# Patient Record
Sex: Female | Born: 1955 | Race: White | Hispanic: No | Marital: Married | Smoking: Former smoker
Health system: Southern US, Community
[De-identification: ages and names within clinical notes are randomized; demographics above are authoritative.]

## PROBLEM LIST (undated history)

## (undated) DIAGNOSIS — F329 Major depressive disorder, single episode, unspecified: Secondary | ICD-10-CM

## (undated) DIAGNOSIS — G939 Disorder of brain, unspecified: Secondary | ICD-10-CM

## (undated) DIAGNOSIS — N390 Urinary tract infection, site not specified: Secondary | ICD-10-CM

## (undated) DIAGNOSIS — F32A Depression, unspecified: Secondary | ICD-10-CM

## (undated) DIAGNOSIS — F04 Amnestic disorder due to known physiological condition: Secondary | ICD-10-CM

## (undated) DIAGNOSIS — K5641 Fecal impaction: Secondary | ICD-10-CM

## (undated) HISTORY — DX: Depression, unspecified: F32.A

## (undated) HISTORY — DX: Fecal impaction: K56.41

## (undated) HISTORY — DX: Disorder of brain, unspecified: G93.9

## (undated) HISTORY — DX: Urinary tract infection, site not specified: N39.0

## (undated) HISTORY — DX: Amnestic disorder due to known physiological condition: F04

## (undated) HISTORY — DX: Major depressive disorder, single episode, unspecified: F32.9

---

## 2015-06-10 ENCOUNTER — Encounter (INDEPENDENT_AMBULATORY_CARE_PROVIDER_SITE_OTHER): Payer: Self-pay | Admitting: *Deleted

## 2015-07-07 ENCOUNTER — Encounter (INDEPENDENT_AMBULATORY_CARE_PROVIDER_SITE_OTHER): Payer: Self-pay | Admitting: Internal Medicine

## 2015-07-07 ENCOUNTER — Ambulatory Visit (INDEPENDENT_AMBULATORY_CARE_PROVIDER_SITE_OTHER): Payer: Medicare PPO | Admitting: Internal Medicine

## 2015-07-07 ENCOUNTER — Encounter (INDEPENDENT_AMBULATORY_CARE_PROVIDER_SITE_OTHER): Payer: Self-pay | Admitting: *Deleted

## 2015-07-07 VITALS — BP 132/76 | HR 88 | Temp 98.4°F | Ht 62.0 in | Wt 123.9 lb

## 2015-07-07 DIAGNOSIS — F32A Depression, unspecified: Secondary | ICD-10-CM | POA: Insufficient documentation

## 2015-07-07 DIAGNOSIS — R197 Diarrhea, unspecified: Secondary | ICD-10-CM | POA: Diagnosis not present

## 2015-07-07 DIAGNOSIS — K449 Diaphragmatic hernia without obstruction or gangrene: Secondary | ICD-10-CM | POA: Insufficient documentation

## 2015-07-07 DIAGNOSIS — G931 Anoxic brain damage, not elsewhere classified: Secondary | ICD-10-CM | POA: Insufficient documentation

## 2015-07-07 DIAGNOSIS — F329 Major depressive disorder, single episode, unspecified: Secondary | ICD-10-CM | POA: Insufficient documentation

## 2015-07-07 DIAGNOSIS — F04 Amnestic disorder due to known physiological condition: Secondary | ICD-10-CM | POA: Insufficient documentation

## 2015-07-07 DIAGNOSIS — R140 Abdominal distension (gaseous): Secondary | ICD-10-CM

## 2015-07-07 DIAGNOSIS — N390 Urinary tract infection, site not specified: Secondary | ICD-10-CM | POA: Insufficient documentation

## 2015-07-07 NOTE — Patient Instructions (Addendum)
US abdomen OV in 8 weeks. Stool diary

## 2015-07-07 NOTE — Progress Notes (Addendum)
   Subjective:    Patient ID: Hannah Wyatt, female    DOB: [birth year], 60 y.o.   MRN: [id number]  HPI Referred to our office by Dr. Salvatore Decent for chronic diarrhea. In [place] she was started on Flagyl x 7 days tid. She was empirically for possible infectious diarrhea. No fever associated with her symptoms. 05/22/2015:  stool cultures was negative as well as C-diff. Patient is mentally challenged. She says she had diarrhea x 2 weeks. She now says her stools are normal now. She says she does not have any diarrhea now. She says she was incontinent of stool when she had the diarrhea. She has not drank in 5 yrs.  Her appetite is good. No weight loss. Her last colonoscopy was in 2014. I will get that report. She has not been on any recent antibiotics.  Mother tells me (who is seeing Dr. Karilyn Cota today) that her daughter a couple of years ago was septic from a UTI. She was found at home and apparently was cyanotic. She suffered a brain injury from this.  Patient has not drank in over 5 yrs.    05/16/2013 Coloscopy: Family hx of colonic neoplasm. Change in bowel habits. Normal colonoscopy, but significantly limited due to stool. Scope advanced to cecum without difficulty.   05/22/2015 NA 142, K 4.9, Chloride 103, Glucose 91. Albumin 4.0. Total protein 6.6, AST 28, ALP 76, total bili 0.4, ALT 29. Urinalysis was negative.  Review of Systems Past Medical History  Diagnosis Date  . Korsakoff disease   . Depression   . Brain damage   . UTI (lower urinary tract infection)     No past surgical history on file.  No Known Allergies  No current outpatient prescriptions on file prior to visit.   No current facility-administered medications on file prior to visit.   Current Outpatient Prescriptions  Medication Sig Dispense Refill  . acetaminophen (TYLENOL) 500 MG tablet Take 500 mg by mouth every 6 (six) hours as needed.    . clonazePAM (KLONOPIN) 0.5 MG tablet Take 0.5 mg by mouth as needed for  anxiety.    . Lactobacillus-Inulin (CULTURELLE DIGESTIVE HEALTH PO) Take by mouth.    . Multiple Vitamins-Minerals (CENTRUM SILVER PO) Take by mouth.    . ziprasidone (GEODON) 20 MG capsule Take 20 mg by mouth 2 (two) times daily with a meal.     No current facility-administered medications for this visit.        Objective:   Physical Exam Blood pressure 132/76, pulse 88, temperature 98.4 F (36.9 C), height  (1.575 m), weight 123 lb 14.4 oz (56.201 kg). Alert and oriented. Skin warm and dry. Oral mucosa is moist.   . Sclera anicteric, conjunctivae is pink. Thyroid not enlarged. No cervical lymphadenopathy. Lungs clear. Heart regular rate and rhythm.  Abdomen is soft. Bowel sounds are positive. No hepatomegaly. No abdominal masses felt. Firmess noted to rt side of abdomen.    Stool brown and with consistency and guaiac negative.       Assessment & Plan:  Diarrhea which has apparently resolved.   She will follow up in 8 weeks. Will get colonoscopy report from Dr. Samuella Cota. US abdomen.

## 2015-07-08 ENCOUNTER — Encounter (INDEPENDENT_AMBULATORY_CARE_PROVIDER_SITE_OTHER): Payer: Self-pay

## 2015-07-13 ENCOUNTER — Ambulatory Visit (INDEPENDENT_AMBULATORY_CARE_PROVIDER_SITE_OTHER): Payer: Self-pay | Admitting: Internal Medicine

## 2015-07-15 ENCOUNTER — Telehealth (INDEPENDENT_AMBULATORY_CARE_PROVIDER_SITE_OTHER): Payer: Self-pay | Admitting: Internal Medicine

## 2015-07-15 ENCOUNTER — Ambulatory Visit (HOSPITAL_COMMUNITY)
Admission: RE | Admit: 2015-07-15 | Discharge: 2015-07-15 | Disposition: A | Discharge status: Home / Self Care | Payer: Medicare PPO | Source: Ambulatory Visit | Attending: Internal Medicine | Admitting: Internal Medicine

## 2015-07-15 DIAGNOSIS — R935 Abnormal findings on diagnostic imaging of other abdominal regions, including retroperitoneum: Secondary | ICD-10-CM

## 2015-07-15 DIAGNOSIS — R197 Diarrhea, unspecified: Secondary | ICD-10-CM | POA: Diagnosis not present

## 2015-07-15 DIAGNOSIS — R140 Abdominal distension (gaseous): Secondary | ICD-10-CM | POA: Diagnosis present

## 2015-07-15 DIAGNOSIS — I700 Atherosclerosis of aorta: Secondary | ICD-10-CM | POA: Diagnosis not present

## 2015-07-15 DIAGNOSIS — N281 Cyst of kidney, acquired: Secondary | ICD-10-CM | POA: Diagnosis not present

## 2015-07-15 NOTE — Telephone Encounter (Signed)
Ct ordered for abnormal [place]

## 2015-07-21 ENCOUNTER — Ambulatory Visit (HOSPITAL_COMMUNITY)
Admission: RE | Admit: 2015-07-21 | Discharge: 2015-07-21 | Disposition: A | Discharge status: Home / Self Care | Payer: Medicare PPO | Source: Ambulatory Visit | Attending: Internal Medicine | Admitting: Internal Medicine

## 2015-07-21 DIAGNOSIS — R935 Abnormal findings on diagnostic imaging of other abdominal regions, including retroperitoneum: Secondary | ICD-10-CM

## 2015-07-21 DIAGNOSIS — N281 Cyst of kidney, acquired: Secondary | ICD-10-CM | POA: Diagnosis not present

## 2015-07-21 LAB — POCT I-STAT CREATININE: CREATININE: 0.6 mg/dL (ref 0.44–1.00)

## 2015-07-21 MED ORDER — IOHEXOL 300 MG/ML  SOLN
100.0000 mL | Freq: Once | INTRAMUSCULAR | Status: AC | PRN
  Administered 2015-07-21: 100 mL via INTRAVENOUS

## 2015-07-22 ENCOUNTER — Telehealth (INDEPENDENT_AMBULATORY_CARE_PROVIDER_SITE_OTHER): Payer: Self-pay | Admitting: Internal Medicine

## 2015-07-22 NOTE — Telephone Encounter (Signed)
Hannah Wyatt, Hannah Wyatt mother called and left a message regarding Hannah Wyatt's current condition. Due to Suraiya feeling bad she took her to [facility]. She's been admitted and was given an enema and magnesium citrate with slight success. She didn't rid of much waste just liquid mostly and was having trouble urinating. Her stomach was also swelling and she felt pain to the touch so a catheter was inserted in her. They started her on Go-Lightly and tried another enema but again, mostly liquid came out. Providers think the impaction is still there and if she has no progress they may go in and try to release it manually. Per Fleet Contras, Dr. Holley Bouche is aware of the situation and said there's nothing he could do GI wise. Please give Fleet Contras a call if needed.  Thank you.

## 2015-07-22 NOTE — Telephone Encounter (Signed)
I have talked with husband

## 2015-07-23 NOTE — Telephone Encounter (Signed)
noted 

## 2015-07-23 NOTE — Telephone Encounter (Signed)
Fleet Contras, Leanor's mother; called back saying Ms. Goyer is still in the hospital and the impaction is still there. She's began a more aggressive treatment that's almost similar to a colonoscopy prep. She's comfortable now and Fleet Contras will keep you informed.   Thank you.

## 2015-07-30 ENCOUNTER — Telehealth (INDEPENDENT_AMBULATORY_CARE_PROVIDER_SITE_OTHER): Payer: Self-pay | Admitting: Internal Medicine

## 2015-07-30 NOTE — Telephone Encounter (Signed)
I advised her to give Mag Citrate and get enemas. She will call me tomorrow.

## 2015-07-30 NOTE — Telephone Encounter (Signed)
Hannah Wyatt, [place] mother left a message saying she gave Fleet Contras an enema and an entire bottle of Magnesium per your instruction. She believes it was successful because Carrie had two bowel movements. She said she'll continue to give her Miralax for a few days to make sure her bowel movements are consistent. She wanted to make you aware of this.  Thank you.

## 2015-07-30 NOTE — Telephone Encounter (Signed)
Hannah Wyatt, [name] mother, left a message saying while she was in the hospital she was given a gallon of "Go-Lightly" and had bowel movements consistently multiple times a day from Thursday to early Saturday morning before being discharged. Since Saturday, she's not had a bowel movement. Fleet Contras gave her Miralax yesterday and today and she still hasn't had a bowel movement. She'd like a phone call regarding this.  Rachel's ph# [phone] or [phone]  Thank you.

## 2015-09-01 ENCOUNTER — Ambulatory Visit (INDEPENDENT_AMBULATORY_CARE_PROVIDER_SITE_OTHER): Payer: Medicare PPO | Admitting: Internal Medicine

## 2015-09-01 ENCOUNTER — Encounter (INDEPENDENT_AMBULATORY_CARE_PROVIDER_SITE_OTHER): Payer: Self-pay | Admitting: Internal Medicine

## 2015-09-01 VITALS — BP 100/84 | HR 72 | Temp 98.1°F | Ht 62.0 in | Wt 122.2 lb

## 2015-09-01 DIAGNOSIS — K5641 Fecal impaction: Secondary | ICD-10-CM

## 2015-09-01 NOTE — Progress Notes (Signed)
Subjective:    Patient ID: Hannah Wyatt, female    DOB: Oct 06, 1955, 60 y.o.   MRN: [id number] PCP Dr. Salvatore Decent.  HPI Here today for f/u of her diarrhea. She was last seen in January of this year. She underwent a CT by me in February which showed a significant fecal impaction. I advised patient to go to drug store and get 2 SS enemas and Mag Citrate. She did not have any results with the enemas and Mag Citrate. She then was advised to go to the ED for disimpaction. Patient was admitted to [facility].  She ultimately had a large BM. She was also given Golytely.  She did have a BM. She is presently taking Miralax daily.  She is having a BM daily. Her stools are nice and formed.  Her appetite is good. No weight loss. Last weight was 123. Today her weight is 122.2. She lives at him with her mother.  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  Hx of etoh abuse. She has not drank in 5 yrs.  She tells me she feels good. She is eating 3 meals a day.   07/20/2014 CT abdomen/pelvis with CM: diarrhea, abdominal mass: IMPRESSION: 1. There is abundant colonic stool in distal colon. Significant stool burden noted in distal sigmoid colon and rectum. Measures at least 18 cm cranial caudally by 13 cm x 11 cm in diameter. Findings are consistent with significant fecal impaction. There is no evidence of rectal mass. 2. No small bowel obstruction. 3. No hydronephrosis or hydroureter. 4. Normal appendix. No pericecal inflammation. 5. 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 A        05/16/2013 Coloscopy: Family hx of colonic neoplasm. Change in bowel habits. Normal colonoscopy, but significantly limited due to stool. Scope advanced to cecum without difficulty.   05/22/2015 NA 142, K 4.9, Chloride 103, Glucose 91. Albumin 4.0. Total protein 6.6, AST 28, ALP 76, total bili 0.4, ALT 29. Urinalysis was negative.  Review of Systems Past Medical History  Diagnosis Date  .  Korsakoff disease   . Depression   . Brain damage   . UTI (lower urinary tract infection)     No past surgical history on file.  No Known Allergies  Current Outpatient Prescriptions on File Prior to Visit  Medication Sig Dispense Refill  . acetaminophen (TYLENOL) 500 MG tablet Take 500 mg by mouth every 6 (six) hours as needed.    . clonazePAM (KLONOPIN) 0.5 MG tablet Take 0.5 mg by mouth as needed for anxiety.    . Lactobacillus-Inulin (CULTURELLE DIGESTIVE HEALTH PO) Take by mouth.    . Multiple Vitamins-Minerals (CENTRUM SILVER PO) Take by mouth.     No current facility-administered medications on file prior to visit.        Objective:   Physical ExamBlood pressure 100/84, pulse 72, temperature 98.1 F (36.7 C), height  (1.575 m), weight 122 lb 3.2 oz (55.43 kg). Alert and oriented. Skin warm and dry. Oral mucosa is moist.   . Sclera anicteric, conjunctivae is pink. Thyroid not enlarged. No cervical lymphadenopathy. Lungs clear. Heart regular rate and rhythm.  Abdomen is soft. Bowel sounds are positive. No hepatomegaly. No abdominal masses felt. No tenderness.  No edema to lower extremities.  Rectal exam: large amt of stool in rectum ? Impaction. Stool is not hard in rectum though.         Assessment & Plan:  Diarrhea/fecal impaction which has now resolved. She does have  a large stool burden in her rectum today, it however is soft.  Mother states she is having a BM daily with MIralax. Continue the MIralax.  Go to drug store and get a SS enema.  [facility]   Mother will call me tomorrow and let me know results.

## 2015-09-01 NOTE — Patient Instructions (Addendum)
SS enema today. Call with results tomorrow.  Miralax daily

## 2015-09-02 ENCOUNTER — Telehealth (INDEPENDENT_AMBULATORY_CARE_PROVIDER_SITE_OTHER): Payer: Self-pay | Admitting: Internal Medicine

## 2015-09-02 NOTE — Telephone Encounter (Signed)
noted 

## 2015-09-02 NOTE — Telephone Encounter (Signed)
Fleet ContrasRachel Quinlivan asked that we let Terri know she followed the instructions of giving Johnny BridgeMartha an enema and said it was very successful. She had two bowel movements last night and she's had two this morning.   Thank you.

## 2015-09-29 ENCOUNTER — Telehealth (INDEPENDENT_AMBULATORY_CARE_PROVIDER_SITE_OTHER): Payer: Self-pay | Admitting: Internal Medicine

## 2015-09-29 NOTE — Telephone Encounter (Signed)
Patient's mother Evonnie DawesRachel Beaudin called stating that her daughter is mentally impaired.  Stated that she has had diarrhea since at least Saturday, but she wasn't aware of it until she noticed that she was cleaning up and hiding the evidence from her.  She stated her daughter was not honest with with her.  She has given her Miralax.  She wants to know if you have any suggestions.  [phone] (H) [phone] (C)

## 2015-09-29 NOTE — Telephone Encounter (Signed)
I advised mother that patient probably needs to be checked for an impaction. She stated she would take [place] to [place] it is easier to get there. Hold Miralax for now.  Call me tomorrow

## 2016-02-15 ENCOUNTER — Encounter (INDEPENDENT_AMBULATORY_CARE_PROVIDER_SITE_OTHER): Payer: Self-pay

## 2016-03-03 ENCOUNTER — Ambulatory Visit (INDEPENDENT_AMBULATORY_CARE_PROVIDER_SITE_OTHER): Payer: Medicare PPO | Admitting: Internal Medicine

## 2017-04-06 IMAGING — CT CT ABD-PELV W/ CM
2 of 4 series · 15 of 46 positions shown, 17 images · IV contrast (Omnipaque 300)
Comparison: Abdominal ultrasound 07/15/2015

CLINICAL DATA: Abdominal abdominal ultrasound, possible rectal mass

EXAM:
CT ABDOMEN AND PELVIS WITH CONTRAST
TECHNIQUE: Multidetector CT imaging of the abdomen and pelvis was performed
using the standard protocol following bolus administration of
intravenous contrast.
CONTRAST:  100mL OMNIPAQUE IOHEXOL 300 MG/ML  SOLN

[Series 2: abd_pel_with 5.0 b40f · axial · 0.65mm/px · z∈[+546,+961]mm · 12 of 93 slices shown, 14 images]
[im 5/93  soft-tissue]
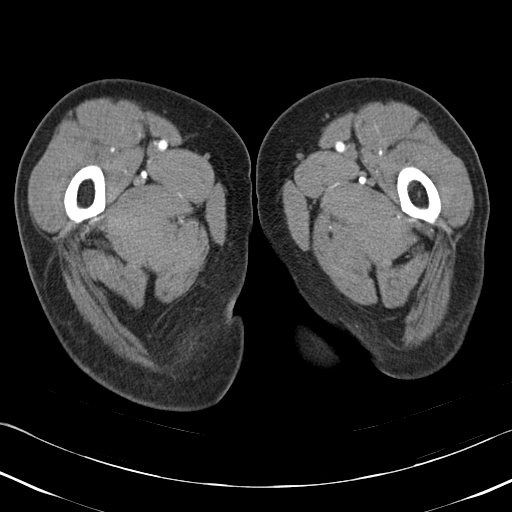
[im 5/93  bone]
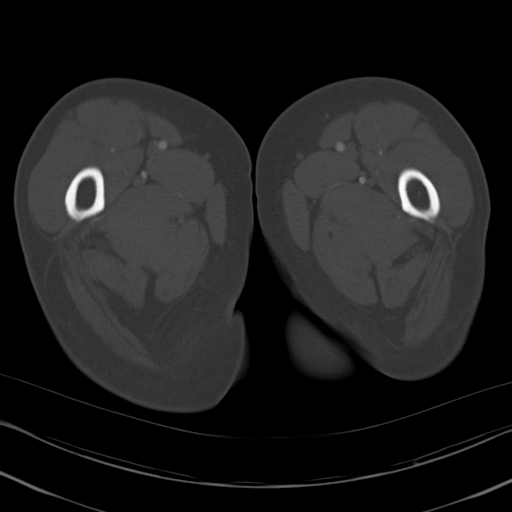
[im 14/93  soft-tissue]
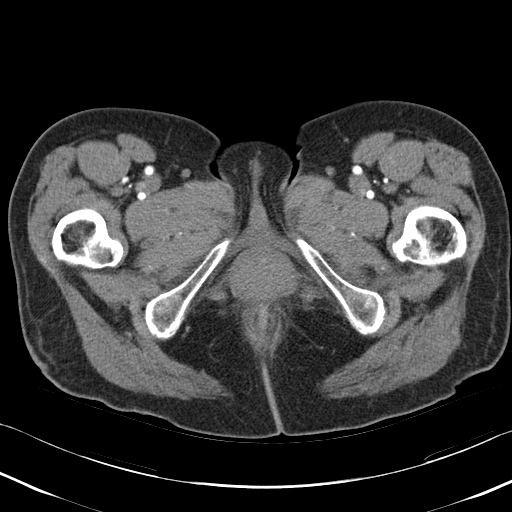
[im 19/93  soft-tissue]
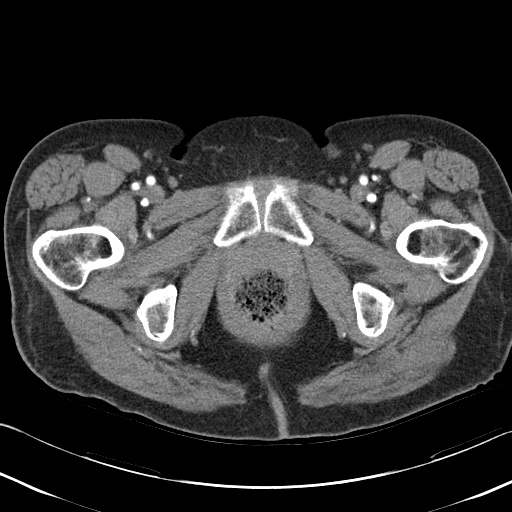
[im 28/93  soft-tissue]
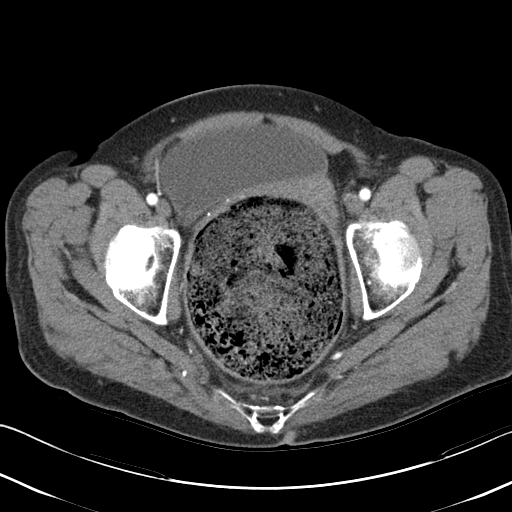
[im 37/93  soft-tissue]
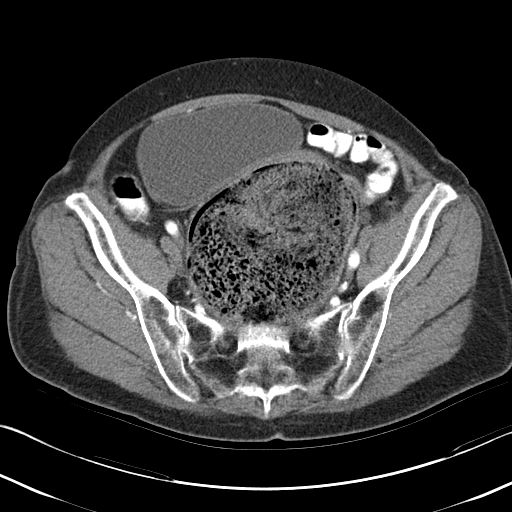
[im 42/93  soft-tissue]
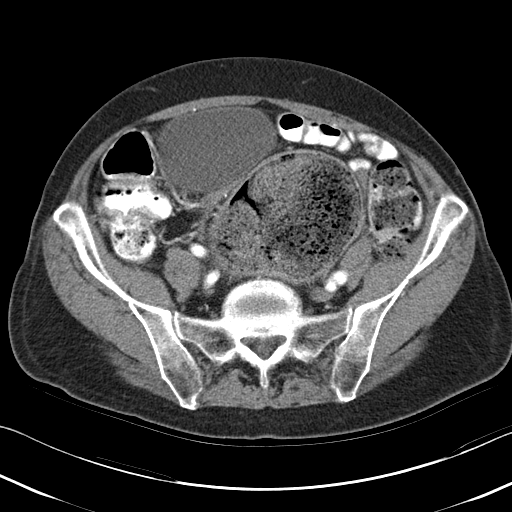
[im 51/93  soft-tissue]
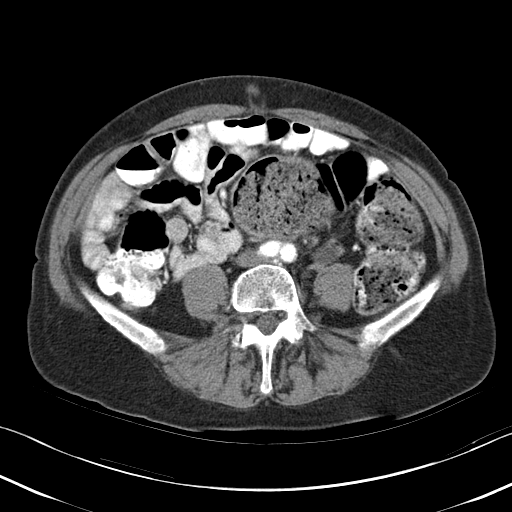
[im 56/93  soft-tissue]
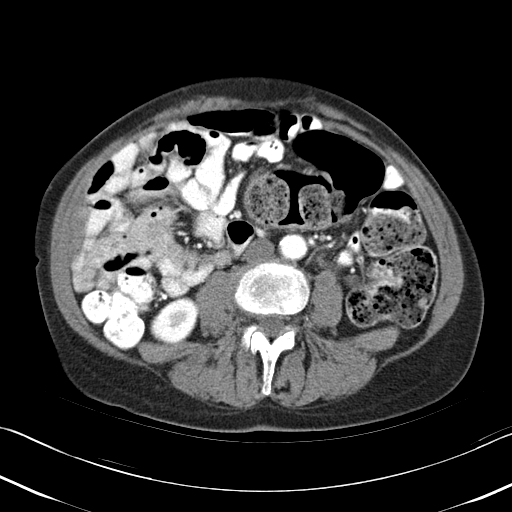
[im 65/93  soft-tissue]
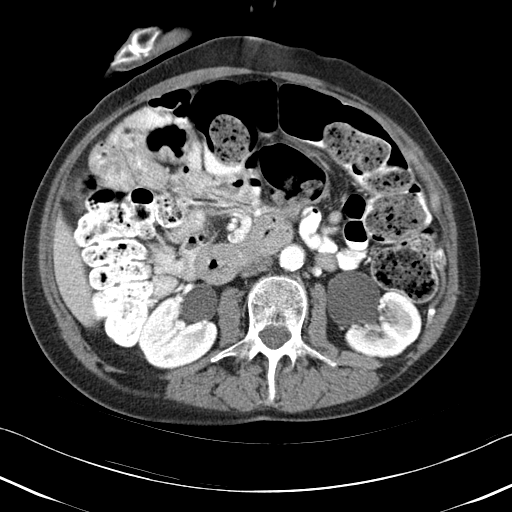
[im 65/93  bone]
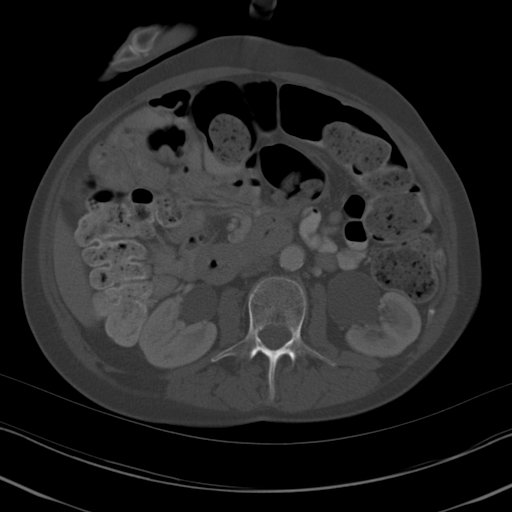
[im 74/93  soft-tissue]
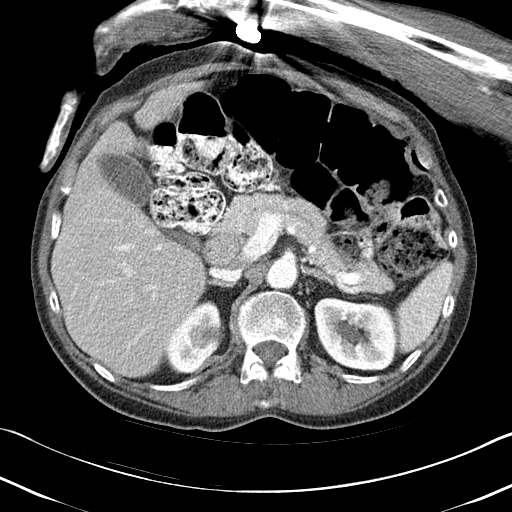
[im 79/93  soft-tissue]
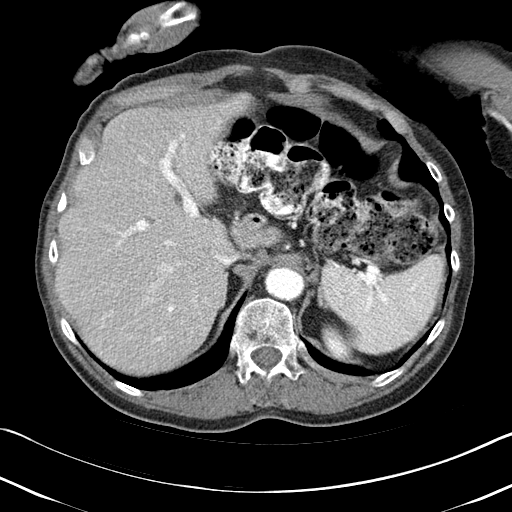
[im 88/93  soft-tissue]
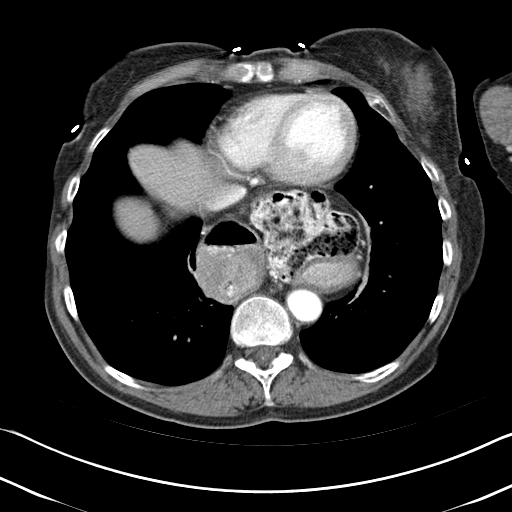

[Series 3: abd_pel_with 3.0 spo cor · coronal · 0.66mm/px · 3 of 80 slices shown]
[im 27/80  soft-tissue]
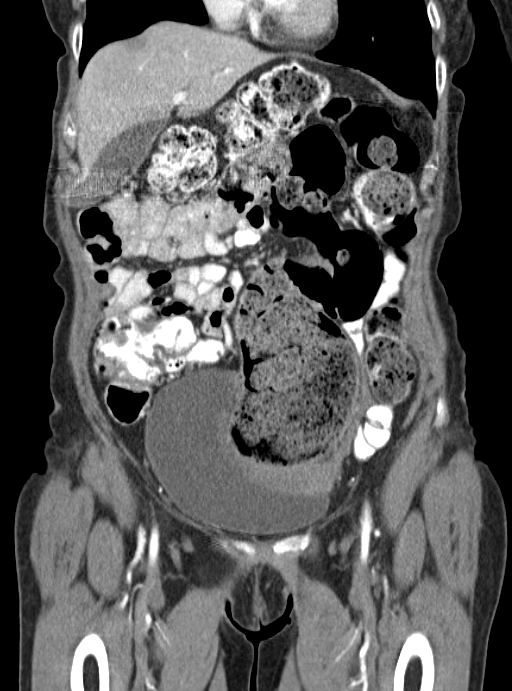
[im 36/80  soft-tissue]
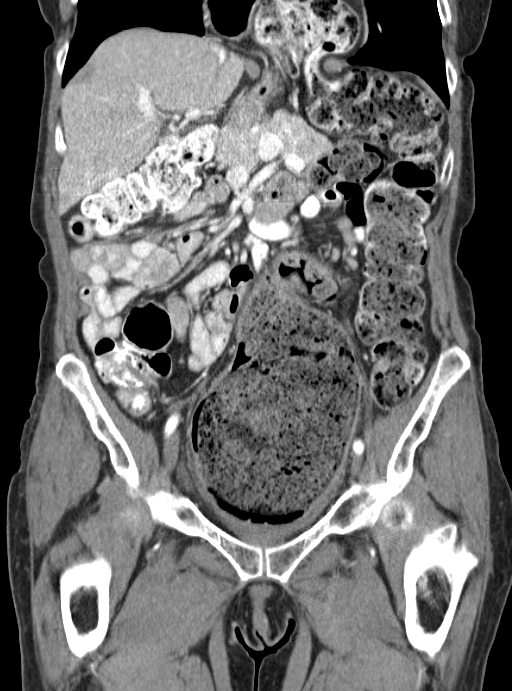
[im 44/80  soft-tissue]
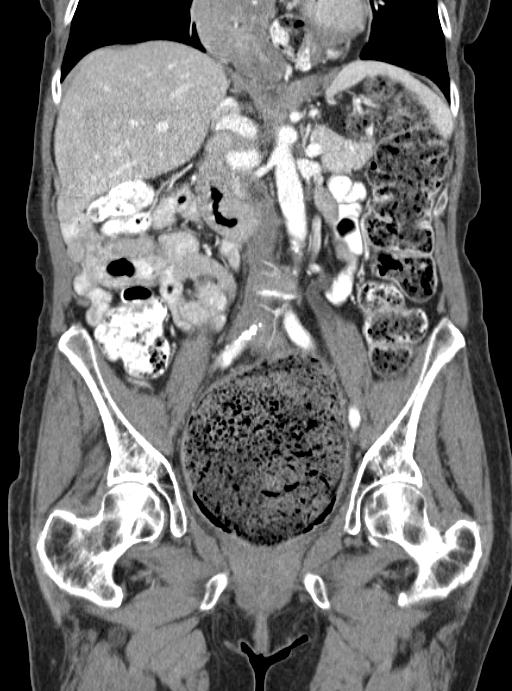

[15 of 46 positions shown; findings below may reference images not displayed]

FINDINGS: Sagittal images of the spine shows diffuse osteopenia. Mild
degenerative changes lumbar spine. There is a large diaphragmatic
hernia in midline with entire the stomach herniated in retrocardiac
position anterior to the spine. There is no evidence of acute
gastric volvulus or gastric outlet obstruction.

Lung bases are unremarkable.

Enhanced liver shows no focal mass. No calcified gallstones are
noted within gallbladder.

Enhanced pancreas, spleen and adrenal glands are unremarkable.

Enhanced kidneys are symmetrical in size. No hydronephrosis or
hydroureter. There is a cyst in medial midpole of the right kidney
measures 1.9 cm. Bilateral dilatation of extrarenal pelvis. Delayed
renal images shows bilateral renal symmetrical excretion.

There is no small bowel obstruction. No aortic aneurysm. Normal
appendix is noted in axial image 53.

There is moderate gas and stool in transverse colon. Abundant stool
noted in splenic flexure of the colon. Abundant stool noted in
descending colon. Moderate stool and gas noted in proximal sigmoid
colon. Markedly redundant sigmoid colon. There is significant stool
burden in distal sigmoid colon and rectum. This extends at least 18
cm cranial caudally and measures 13 x 11.5 cm in diameter. Findings
are consistent with significant fecal impaction. There is no
evidence of rectal mass. There is mass effect on the urinary bladder
which is deviated in anterior/right lateral pelvis. The uterus is
atrophic. No adnexal masses noted.
IMPRESSION: 1. There is abundant colonic stool in distal colon. Significant
stool burden noted in distal sigmoid colon and rectum. Measures at
least 18 cm cranial caudally by 13 cm x 11 cm in diameter. Findings
are consistent with significant fecal impaction. There is no
evidence of rectal mass.
2. No small bowel obstruction.
3. No hydronephrosis or hydroureter.
4. Normal appendix.  No pericecal inflammation.
5. Bilateral dilatation of renal pelvis without hydronephrosis. A
right renal cyst measures 2 cm.

## 2017-07-08 IMAGING — US US ABDOMEN COMPLETE
1 series · 13 of 25 positions shown · non-contrast
Comparison: None.

CLINICAL DATA: Abdominal distension

EXAM:
ABDOMEN ULTRASOUND COMPLETE

[Series 1: us abdomen complete · 0.16mm/px · 13 of 92 slices shown]
[im 1/92]
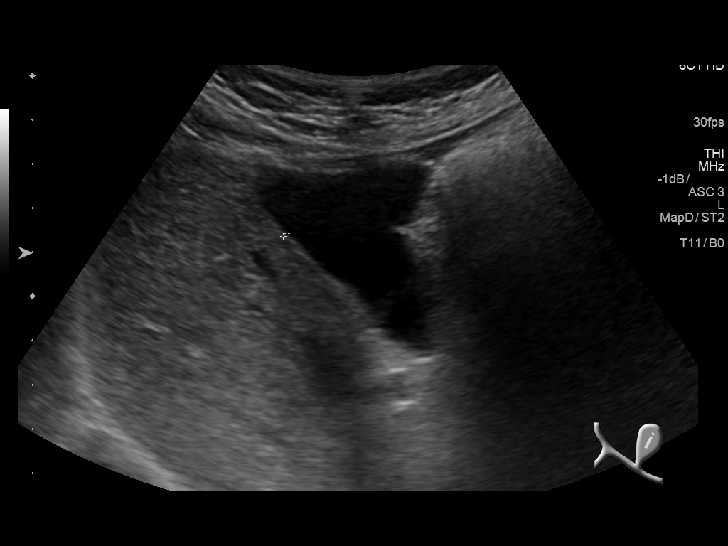
[im 8/92]
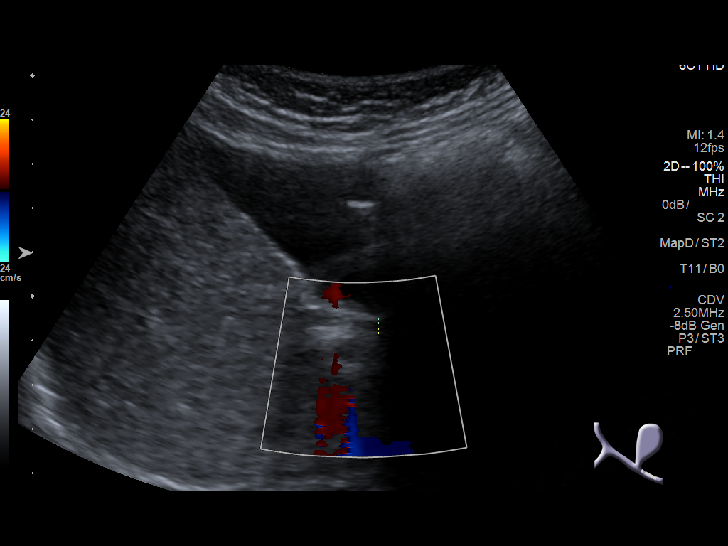
[im 16/92]
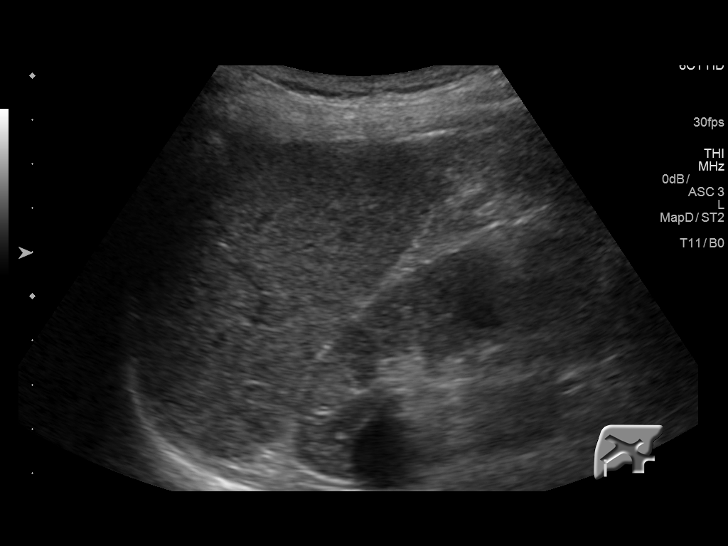
[im 23/92]
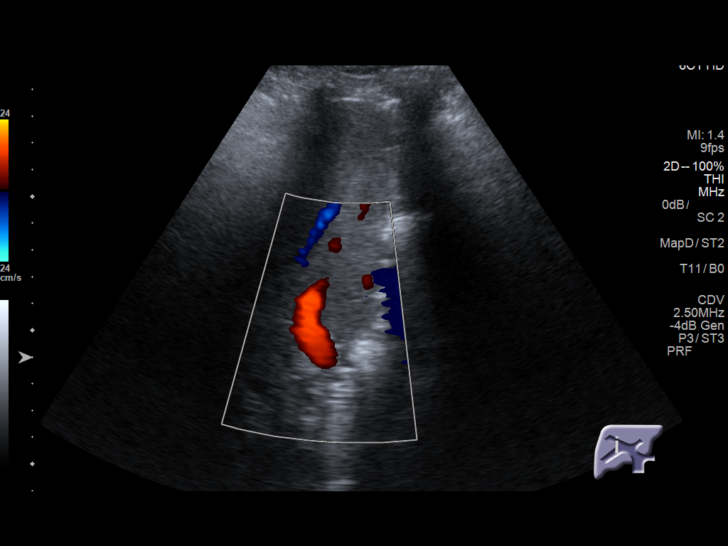
[im 31/92]
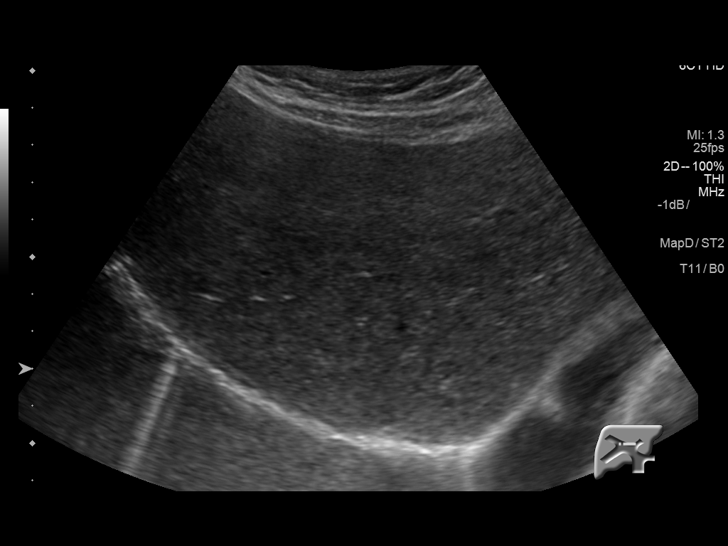
[im 38/92]
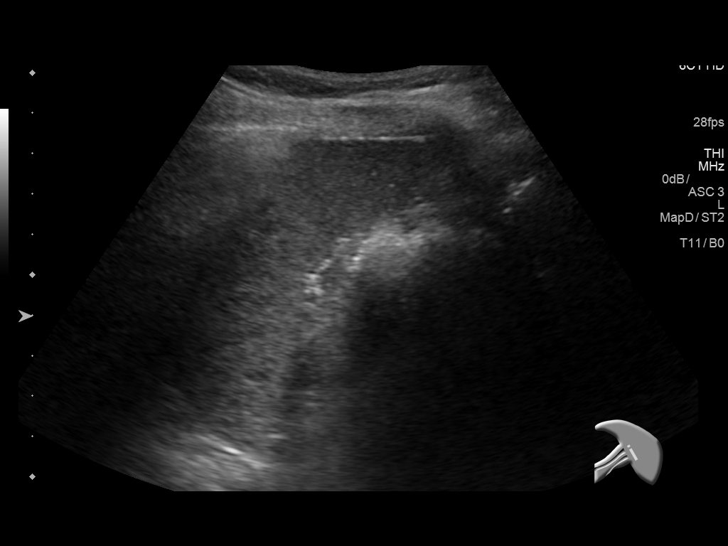
[im 46/92]
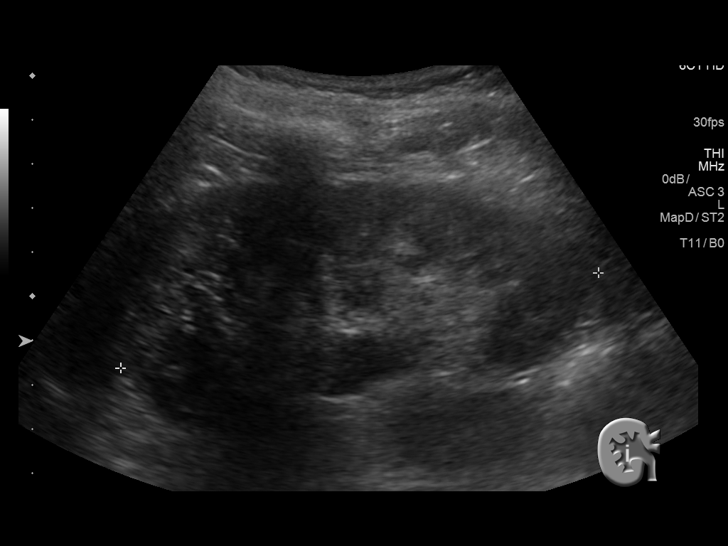
[im 54/92]
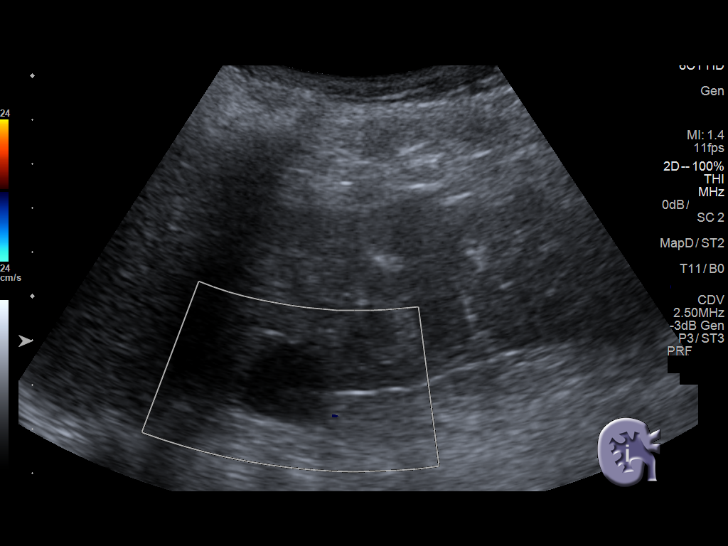
[im 61/92]
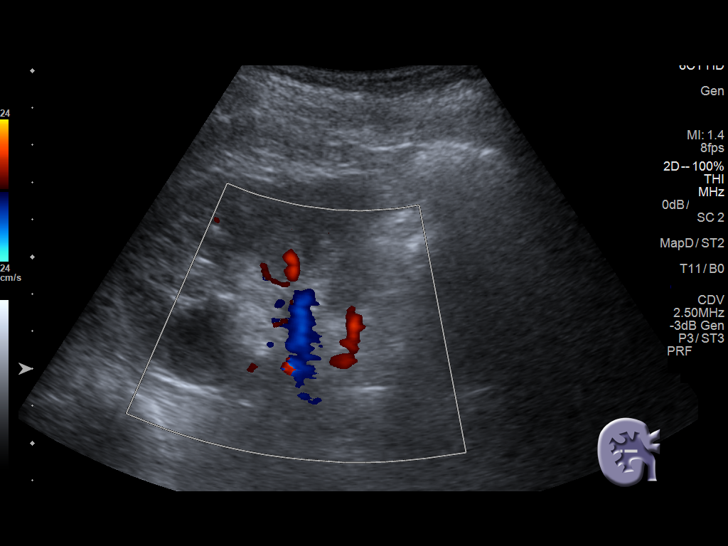
[im 69/92]
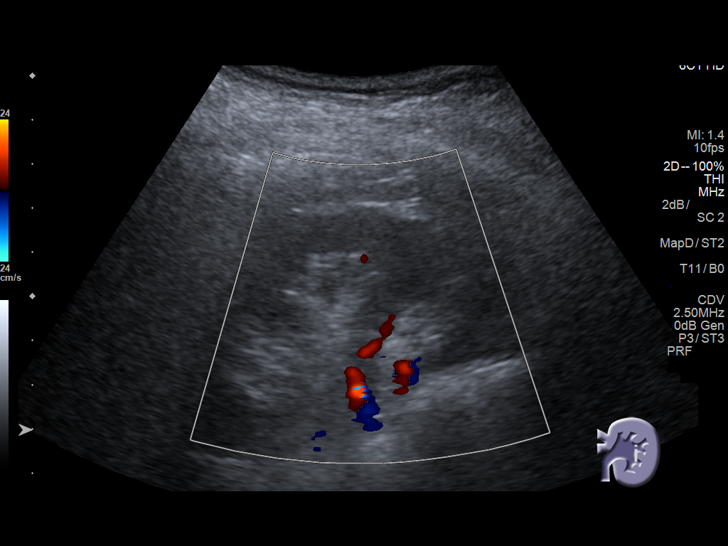
[im 76/92]
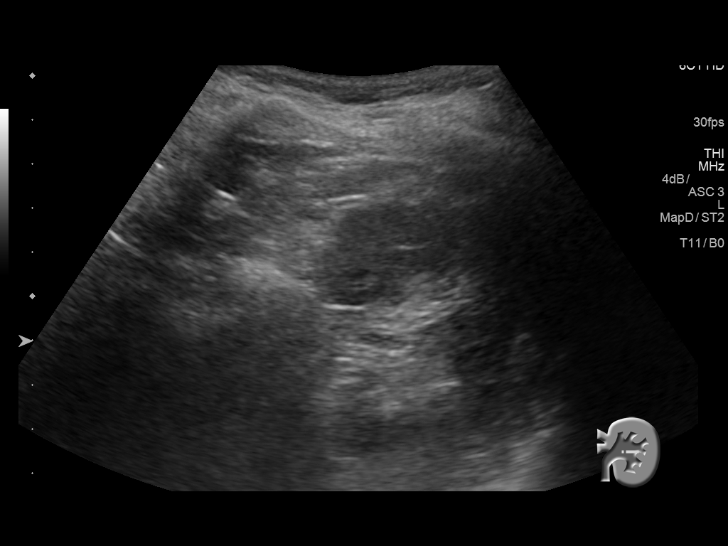
[im 84/92]
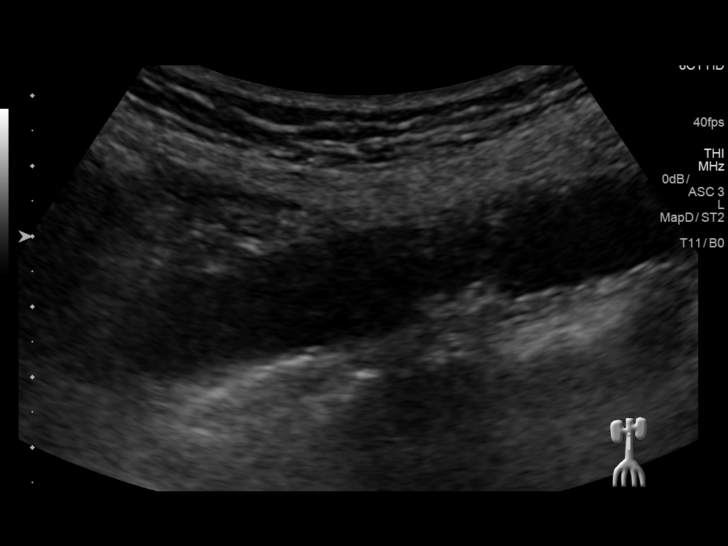
[im 92/92]
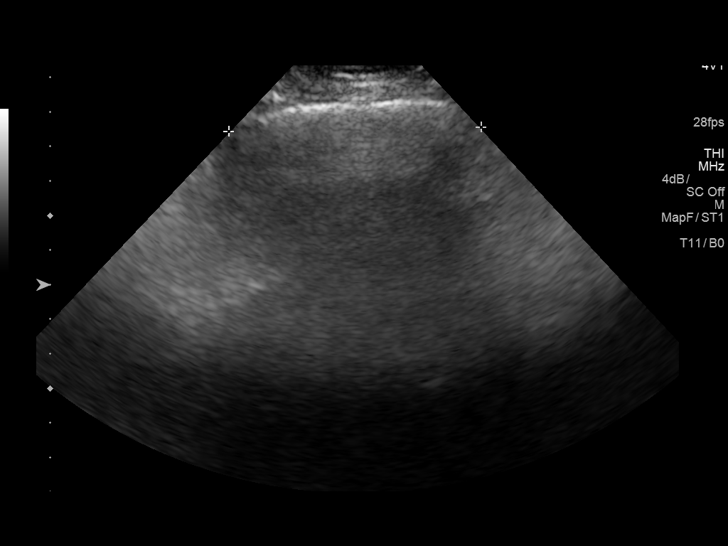

[13 of 25 positions shown; findings below may reference images not displayed]

FINDINGS: Gallbladder: No gallstones or wall thickening visualized. No
sonographic Murphy sign noted by sonographer.

Common bile duct: Diameter: 2.7 mm

Liver: No focal lesion identified. Within normal limits in
parenchymal echogenicity.

IVC: No abnormality visualized.

Pancreas: Not well seen due to overlying bowel gas.

Spleen: Size and appearance within normal limits.

Right Kidney: Length: 11.6 cm. Echogenicity within normal limits. No
hydronephrosis visualized. There are cysts within the right kidney,
largest in the upper pole measuring 2.3 cm.

Left Kidney: Length: 10.6 cm. Echogenicity within normal limits. No
mass or hydronephrosis visualized.

Abdominal aorta: No aneurysm visualized. There is diffuse
atherosclerosis.

Other findings: There is diffuse bladder wall thickening. There is a
questioned mass in the left lower quadrant versus shadowing from
overlying bowel gas in the left lower quadrant.
IMPRESSION: No acute abnormality identified in the abdomen.

Right kidney cysts.

Atherosclerosis of the aorta without aneurysmal dilatation.

Diffuse bladder wall thickening.

Question mass versus shadowing from overlying bowel gas in the left
lower quadrant. Further evaluation with a CT scan is recommended.

## 2018-07-17 ENCOUNTER — Encounter (INDEPENDENT_AMBULATORY_CARE_PROVIDER_SITE_OTHER): Payer: Self-pay | Admitting: Internal Medicine

## 2018-07-17 ENCOUNTER — Ambulatory Visit (INDEPENDENT_AMBULATORY_CARE_PROVIDER_SITE_OTHER): Payer: Medicare PPO | Admitting: Internal Medicine

## 2018-07-17 ENCOUNTER — Ambulatory Visit (HOSPITAL_COMMUNITY)
Admission: RE | Admit: 2018-07-17 | Discharge: 2018-07-17 | Disposition: A | Discharge status: Home / Self Care | Payer: Medicare PPO | Source: Ambulatory Visit | Attending: Internal Medicine | Admitting: Internal Medicine

## 2018-07-17 VITALS — BP 122/80 | HR 76 | Temp 97.7°F | Ht 62.0 in | Wt 117.6 lb

## 2018-07-17 DIAGNOSIS — K5641 Fecal impaction: Secondary | ICD-10-CM | POA: Diagnosis present

## 2018-07-17 HISTORY — DX: Fecal impaction: K56.41

## 2018-07-17 NOTE — Patient Instructions (Addendum)
May disimpact. Fleets enema. Stool softener daily.  Daily bowel movement record

## 2018-07-17 NOTE — Progress Notes (Addendum)
   Subjective:    Patient ID: Hannah Wyatt, female    DOB: [birth year], 63 y.o.   MRN: [id number] [phone] [facility]. Ask for April or Doranda HPI Here today with c/o d diarrhea. Last seen in April of 2017. She had a large fecal impaction.  Care giver in room and is providing hx.  Resident of [facility] (Dementia). Hx of Koraskoff disease.  She has had diarrhea off and on for about a year or 2. Caregiver states it is getting worse.  Her stools are runny. She has taken Imodium without any relief. Is taking Questran now. Appetite is not good. She picks at her food. She will get her desserts.  Has a BM usually daily.  No recent antibiotics.  Last seen in 2017 and she had a severe impaction and was actually admitted to [facility] for this.    05/16/2013 Coloscopy: Family hx of colonic neoplasm. Change in bowel habits. Normal colonoscopy, but significantly limited due to stool. Scope advanced to cecum without difficulty.   07/21/2015 CT abdomen/pelvis with CM: Possible rectal mass 1. There is abundant colonic stool in distal colon. Significant stool burden noted in distal sigmoid colon and rectum. Measures at least 18 cm cranial caudally by 13 cm x 11 cm in diameter. Findings are consistent with significant fecal impaction. There is no evidence of rectal mass. 2. No small bowel obstruction. 3. No hydronephrosis or hydroureter. 4. Normal appendix.  No pericecal inflammation. 5. 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 A right renal cyst measures 2 cm.    Review of Systems Past Medical History:  Diagnosis Date  . Brain damage   . Depression   . Korsakoff disease (HCC)   . UTI (lower urinary tract infection)       No Known Allergies  Current Outpatient Medications on File Prior to Visit  Medication Sig Dispense Refill  . ALPRAZolam (XANAX) 0.5 MG tablet Take 0.5 mg by mouth as needed for anxiety.    . cholestyramine (QUESTRAN) 4  GM/DOSE powder Take 7 g by mouth daily.    Marland Kitchen docusate sodium (COLACE) 100 MG capsule Take 100 mg by mouth as needed for mild constipation.    Marland Kitchen loperamide (LOPERAMIDE A-D) 2 MG tablet Take 4 mg by mouth as needed for diarrhea or loose stools.    . ziprasidone (GEODON) 20 MG capsule Take 20 mg by mouth 2 (two) times daily with a meal.     No current facility-administered medications on file prior to visit.         Objective:   Physical Exam Blood pressure 122/80, pulse 76, temperature 97.7 F (36.5 C), height 5\' 2"  (1.575 m), weight 117 lb 9.6 oz (53.3 kg). Alert and oriented. Skin warm and dry. Oral mucosa is moist.   . Sclera anicteric, conjunctivae is pink. Thyroid not enlarged. No cervical lymphadenopathy. Lungs clear. Heart regular rate and rhythm.  Abdomen is soft. Bowel sounds are positive. No hepatomegaly. No abdominal masses felt. No tenderness.  No edema to lower extremities.  Rectal exam: Patient has a large fecal impaction.         Assessment & Plan:  Chronic diarrhea/large fecal impaction. I talked with Caregiver. She will try to disimpact patient once home. May use an enema.  Needs to take a stool softener daily. Will get KUB.

## 2018-07-18 ENCOUNTER — Telehealth (INDEPENDENT_AMBULATORY_CARE_PROVIDER_SITE_OTHER): Payer: Self-pay | Admitting: Internal Medicine

## 2018-07-18 NOTE — Telephone Encounter (Signed)
I advised caregiver at assisted living to take patient to the ED for disimpaction.

## 2019-01-05 DEATH — deceased
